# Patient Record
Sex: Male | Born: 1945 | Race: White | Hispanic: No | Marital: Single | Smoking: Never smoker
Health system: Southern US, Community
[De-identification: ages and names within clinical notes are randomized; demographics above are authoritative.]

## PROBLEM LIST (undated history)

## (undated) DIAGNOSIS — R3129 Other microscopic hematuria: Secondary | ICD-10-CM

## (undated) DIAGNOSIS — J386 Stenosis of larynx: Secondary | ICD-10-CM

## (undated) DIAGNOSIS — H905 Unspecified sensorineural hearing loss: Secondary | ICD-10-CM

## (undated) DIAGNOSIS — M3130 Wegener's granulomatosis without renal involvement: Secondary | ICD-10-CM

## (undated) DIAGNOSIS — N059 Unspecified nephritic syndrome with unspecified morphologic changes: Secondary | ICD-10-CM

## (undated) DIAGNOSIS — R42 Dizziness and giddiness: Secondary | ICD-10-CM

## (undated) DIAGNOSIS — D509 Iron deficiency anemia, unspecified: Secondary | ICD-10-CM

## (undated) DIAGNOSIS — M542 Cervicalgia: Secondary | ICD-10-CM

## (undated) DIAGNOSIS — E538 Deficiency of other specified B group vitamins: Secondary | ICD-10-CM

## (undated) DIAGNOSIS — L719 Rosacea, unspecified: Secondary | ICD-10-CM

## (undated) HISTORY — DX: Dizziness and giddiness: R42

## (undated) HISTORY — PX: NOSE SURGERY: SHX723

## (undated) HISTORY — DX: Wegener's granulomatosis without renal involvement: M31.30

## (undated) HISTORY — DX: Stenosis of larynx: J38.6

## (undated) HISTORY — DX: Rosacea, unspecified: L71.9

## (undated) HISTORY — DX: Other microscopic hematuria: R31.29

## (undated) HISTORY — DX: Deficiency of other specified B group vitamins: E53.8

## (undated) HISTORY — DX: Cervicalgia: M54.2

## (undated) HISTORY — PX: OTHER SURGICAL HISTORY: SHX169

## (undated) HISTORY — DX: Unspecified nephritic syndrome with unspecified morphologic changes: N05.9

## (undated) HISTORY — DX: Iron deficiency anemia, unspecified: D50.9

## (undated) HISTORY — DX: Unspecified sensorineural hearing loss: H90.5

## (undated) HISTORY — PX: GASTRIC BYPASS: SHX52

---

## 2002-01-26 ENCOUNTER — Emergency Department (HOSPITAL_COMMUNITY): Admission: EM | Admit: 2002-01-26 | Discharge: 2002-01-26 | Payer: Self-pay | Admitting: *Deleted

## 2004-02-16 ENCOUNTER — Inpatient Hospital Stay (HOSPITAL_COMMUNITY): Admission: EM | Admit: 2004-02-16 | Discharge: 2004-02-20 | Payer: Self-pay | Admitting: Emergency Medicine

## 2004-02-22 ENCOUNTER — Inpatient Hospital Stay (HOSPITAL_COMMUNITY): Admission: EM | Admit: 2004-02-22 | Discharge: 2004-02-26 | Payer: Self-pay | Admitting: Emergency Medicine

## 2006-08-07 ENCOUNTER — Ambulatory Visit: Payer: Self-pay | Admitting: General Practice

## 2006-08-18 ENCOUNTER — Encounter: Payer: Self-pay | Admitting: Specialist

## 2006-09-11 ENCOUNTER — Encounter: Payer: Self-pay | Admitting: Specialist

## 2015-01-12 ENCOUNTER — Ambulatory Visit: Payer: Self-pay | Admitting: Internal Medicine

## 2015-02-02 ENCOUNTER — Inpatient Hospital Stay: Payer: Self-pay | Admitting: Internal Medicine

## 2015-02-03 DIAGNOSIS — I517 Cardiomegaly: Secondary | ICD-10-CM

## 2015-02-05 ENCOUNTER — Other Ambulatory Visit: Payer: Self-pay | Admitting: Physician Assistant

## 2015-02-05 DIAGNOSIS — D649 Anemia, unspecified: Secondary | ICD-10-CM

## 2015-02-05 DIAGNOSIS — R531 Weakness: Secondary | ICD-10-CM

## 2015-02-10 ENCOUNTER — Ambulatory Visit
Admit: 2015-02-10 | Disposition: A | Discharge status: Home / Self Care | Payer: Self-pay | Attending: Internal Medicine | Admitting: Internal Medicine

## 2015-02-11 DIAGNOSIS — R001 Bradycardia, unspecified: Secondary | ICD-10-CM

## 2015-04-06 LAB — SURGICAL PATHOLOGY

## 2015-04-12 NOTE — Consult Note (Signed)
Pt with kidney biopsy today, await findings.  He does have heme pos stool and I plan to do EGD Monday and then colon if needed.  Dr. Servando SnareWohl to cover for me til Monday.  Electronic Signatures: Scot JunElliott, Boniface Goffe T (MD)  (Signed on 26-Feb-16 17:57)  Authored  Last Updated: 26-Feb-16 17:57 by Scot JunElliott, Forrest Demuro T (MD)

## 2015-04-12 NOTE — Consult Note (Signed)
Chief Complaint:  Subjective/Chief Complaint Patient seen briefly for chart review and examination.  Paitent admitted with significant anemia, weight loss, found with heme positive stool.  Denies other GI sx except poor appetite.  EGD done today showing post operative/gastric bypass anatomy, this likely contributory to nutritional deficits including iron def.   VITAL SIGNS/ANCILLARY NOTES: **Vital Signs.:   29-Feb-16 12:55  Vital Signs Type Routine  Temperature Temperature (F) 97.5  Temperature Source oral  Pulse Pulse 53  Respirations Respirations 18  Systolic BP Systolic BP 449  Diastolic BP (mmHg) Diastolic BP (mmHg) 72  Mean BP 86  Pulse Ox % Pulse Ox % 98  Pulse Ox Activity Level  At rest  Oxygen Delivery Room Air/ 21 %   Brief Assessment:  Cardiac Regular   Respiratory clear BS   Gastrointestinal details normal Soft  Nontender  Nondistended  No masses palpable  Bowel sounds normal   Lab Results: Routine BB:  26-Feb-16 11:53   ABO Group + Rh Type A Positive  Antibody Screen NEGATIVE (Result(s) reported on 06 Feb 2015 at 01:34PM.)  Routine Chem:  26-Feb-16 03:58   Glucose, Serum 97  BUN  63  Creatinine (comp)  2.68  Sodium, Serum 141  Potassium, Serum  3.4  Chloride, Serum 102  CO2, Serum 29  Calcium (Total), Serum  7.3  Anion Gap 10  Osmolality (calc) 299  eGFR (African American)  31  eGFR (Non-African American)  25 (eGFR values <38m/min/1.73 m2 may be an indication of chronic kidney disease (CKD). Calculated eGFR, using the MRDR Study equation, is useful in  patients with stable renal function. The eGFR calculation will not be reliable in acutely ill patients when serum creatinine is changing rapidly. It is not useful in patients on dialysis. The eGFR calculation may not be applicable to patients at the low and high extremes of body sizes, pregnant women, and vegetarians.)  Routine Sero:  24-Feb-16 21:06   Occult Blood, Feces POSITIVE (Result(s) reported  on 04 Feb 2015 at 09:25PM.)  Routine Coag:  26-Feb-16 11:53   Prothrombin  16.2 (11.4-15.0 NOTE: New Reference Range  01/09/15)  INR 1.3 (INR reference interval applies to patients on anticoagulant therapy. A single INR therapeutic range for coumarins is not optimal for all indications; however, the suggested range for most indications is 2.0 - 3.0. Exceptions to the INR Reference Range may include: Prosthetic heart valves, acute myocardial infarction, prevention of myocardial infarction, and combinations of aspirin and anticoagulant. The need for a higher or lower target INR must be assessed individually. Reference: The Pharmacology and Management of the Vitamin K  antagonists: the seventh ACCP Conference on Antithrombotic and Thrombolytic Therapy. CQPRFF.6384Sept:126 (3suppl): [id number] A HCT value >55% may artifactually increase the PT.  In one study,  the increase was an average of 25%. Reference:  "Effect on Routine and Special Coagulation Testing Values of Citrate Anticoagulant Adjustment in Patients with High HCT Values." American Journal of Clinical Pathology 2006;126:400-405.)  Routine Hem:  26-Feb-16 03:58   WBC (CBC)  14.8  RBC (CBC)  3.25  Hemoglobin (CBC)  8.1  Hematocrit (CBC)  25.4  Platelet Count (CBC)  626  MCV  78  MCH  24.8  MCHC  31.8  RDW  20.2  Neutrophil % 80.7  Lymphocyte % 6.3  Monocyte % 4.5  Eosinophil % 7.9  Basophil % 0.6  Neutrophil #  12.0  Lymphocyte #  0.9  Monocyte # 0.7  Eosinophil #  1.2  Basophil # 0.1 (  Result(s) reported on 06 Feb 2015 at 04:30AM.)    11:53   WBC (CBC)  14.0  RBC (CBC)  3.32  Hemoglobin (CBC)  8.3  Hematocrit (CBC)  26.2  Platelet Count (CBC)  657  MCV  79  MCH  25.0  MCHC  31.7  RDW  19.5  Neutrophil % 81.0  Lymphocyte % 7.3  Monocyte % 3.8  Eosinophil % 7.6  Basophil % 0.3  Neutrophil #  11.4  Lymphocyte # 1.0  Monocyte # 0.5  Eosinophil #  1.1  Basophil # 0.0 (Result(s) reported on 06 Feb 2015 at  12:29PM.)   Radiology Results: XRay:    22-Feb-16 11:30, Chest PA and Lateral  Chest PA and Lateral   REASON FOR EXAM:    Shortness of breath  COMMENTS:       PROCEDURE: DXR - DXR CHEST PA (OR AP) AND LATERAL  - Feb 02 2015 11:30AM     CLINICAL DATA:  Shortness of breath.    EXAM:  CHEST  2 VIEW    COMPARISON:  None.    FINDINGS:  The heart size and mediastinal contours are within normal limits.  Both lungs are clear. No pneumothorax or pleural effusion is noted.  The visualized skeletal structures are unremarkable.     IMPRESSION:  No acute cardiopulmonary abnormality seen.      Electronically Signed    By: Marijo Conception, M.D.    On: 02/02/2015 12:15         Verified By: Marveen Reeks, M.D.,  [place]:    23-Feb-16 16:59, US Kidney Bilateral  US Kidney Bilateral   REASON FOR EXAM:    ARF  COMMENTS:       PROCEDURE: [place]  - US KIDNEY  - Feb 03 2015  4:59PM     CLINICAL DATA:  69 year old male with acute renal failure.    EXAM:  RENAL/URINARY TRACT ULTRASOUND COMPLETE    COMPARISON:  None.    FINDINGS:  Right Kidney:  Length: 12.6 cm. Renal echogenicity is upper limits of normal. No  mass or hydronephrosis visualized.    Left Kidney:    Length: 13.2 cm. Renal echogenicity is upper limits of normal. No  mass or hydronephrosis visualized.    Bladder:    Decompressed and not well visualized.     IMPRESSION:  Normal sized kidneys with upper limits of normal renal echogenicity  which could be reflection of medical renal disease. No evidence of  hydronephrosis.    Bladder decompressed and not well visualized.      Electronically Signed    By: Margarette Canada M.D.    On: 02/03/2015 17:28         Verified By: Lura Em, M.D.,    26-Feb-16 16:28, US Guide Needle Biopsy Other Than Breast  US Guide Needle Biopsy Other Than Breast   REASON FOR EXAM:    positive ANCA antibody, acute renal failure  COMMENTS:       PROCEDURE: [place]  - US GUIDED BX/ASPIRATION  NOT BR  - Feb 06 2015  4:28PM     CLINICAL DATA:  69 year old with positiveANCA antibody and acute  renal failure. Renal biopsy has been requested.    EXAM:  ULTRASOUND-GUIDED RIGHT RENAL BIOPSY    Physician: Stephan Minister. Anselm Pancoast, MD    FLUOROSCOPY TIME:  None  MEDICATIONS:  2 mg versed, 75 mcg fentanyl. A radiology nurse monitored the  patient for moderate sedation.    ANESTHESIA/SEDATION:  Moderate sedation time:  25 minutes    PROCEDURE:  The procedure was explained to the patient. The risks and benefits  of the procedure were discussed and the patient's questions were  addressed. Specifically, the risk of bleeding was discussed with the  patient. Informed consent was obtained from the patient.    Patient was unable to lay completely prone. Patient was placed in a  modified right lateral decubitus position. The right kidney was felt  to be the most accessible for biopsy. The right lower flank was  prepped with chlorhexidine. A sterile field was created. The skin  and soft tissues were anesthetized with 1% lidocaine. 16 gauge Bard  core needle was directed into the right kidney lower pole cortex  with ultrasound guidance. Core biopsies obtained. A total of 3 core  biopsies were obtained. Bandage placed over the puncture site.    FINDINGS:  Normal appearance of the right kidney without hydronephrosis. Core  samples were obtained from the lower pole cortex. No significant  bleeding following the core biopsies.    COMPLICATIONS:  None   IMPRESSION:  Ultrasound-guided core biopsies of the right kidney lower pole.      Electronically Signed    By: Markus Daft M.D.    On: 02/06/2015 18:00         Verified By: Burman Riis, M.D.,  CT:    25-Feb-16 18:56, CT Abdomen and Pelvis Without Contrast  CT Abdomen and Pelvis Without Contrast   REASON FOR EXAM:    (1) weight loss, ureteral mass at [facility]; (2) weight   loss, previous pelvic mass;  COMMENTS:       PROCEDURE: CT  - CT  ABDOMEN AND PELVIS W0  - Feb 05 2015  6:56PM     CLINICAL DATA:  69 year old with weight loss, weakness, anemia and  acute renal failure. History of gastrectomy.    EXAM:  CT ABDOMEN AND PELVIS WITHOUT CONTRAST    TECHNIQUE:  Multidetector CT imaging of the abdomen and pelvis was performed  following the standard protocol without IV contrast.  COMPARISON:  None.    FINDINGS:  There are subtle parenchymal and centrilobular densities at the lung  bases, particularly on the right side. Question a right middle lobe  nodule measuring 5 mm on the first image. No significant pleural  fluid. No evidence free intraperitoneal air.    The gallbladder has been removed. No gross abnormality to the liver,  spleen or pancreas on this non contrast examination. Surgical  changes in the stomach. Prominent periaortic lymph node in the lower  chest measures 1.3 x 1.1 cm on sequence 2, image 11. Normal  appearance of the adrenal glands. There is no evidence for kidney  stones or hydronephrosis. No gross abnormality to either kidney.  No significant free fluid or lymphadenopathy within the abdomen or  pelvis. The prostate is prominent measuring 5.1 x 6.0 x 4.6 cm. No  gross abnormality to the urinary bladder. Cecum is in the right  upper abdomen and there is a normal appearance of the appendix. Oral  contrast within the small bowel and right colon. No evidence for a  bowel obstruction. There is mild edema and stranding within the  central abdominal mesentery. There is also mild subcutaneous edema.    Joint space narrowing in both hips. Degenerative facet arthropathy  in the lower lumbar spine.     IMPRESSION:  No acute abnormalities within the abdomen or pelvis.    Postsurgical changes compatible  with gastric surgery and  cholecystectomy.    There is mild edema in the central abdominal mesentery. Mesenteric  edema may be related to fluid overload because there is also  subcutaneous  edema.    Patchy parenchymal disease at the lung bases, particularly on the  right side. Cannot exclude a right pulmonary nodule in the right  middle lobe. Findings could be related to an infectious or  inflammatory process. Recommend further evaluation with a dedicated  chest CT.    Prostate enlargement.    Electronically Signed    By: Markus Daft M.D.    On: 02/05/2015 19:11         Verified By: Burman Riis, M.D.,   Assessment/Plan:  Assessment/Plan:  Assessment as noted above.  I have discussed the risks benefits and complications of colonoscopy to include not limited to bleeding infectiion perforation and sedationa nd he wishes to proceed.  Scheduled for tomorrow.   Electronic Signatures: Loistine Simas (MD)  (Signed [phone] 20:23)  Authored: Chief Complaint, VITAL SIGNS/ANCILLARY NOTES, Brief Assessment, Lab Results, Radiology Results, Assessment/Plan   Last Updated: 29-Feb-16 20:23 by Loistine Simas (MD)

## 2015-04-12 NOTE — Consult Note (Signed)
Pt abd non tender, non distended, bowel sound present.  He is to have CT today.  Stool heme pos for occult blood.  Depending on CT scan will possibly schedule EGD for tomorrow.  Other consult notes noted.  Electronic Signatures: Scot JunElliott, Felecia Stanfill T (MD)  (Signed on 25-Feb-16 15:21)  Authored  Last Updated: 25-Feb-16 15:21 by Scot JunElliott, Robina Hamor T (MD)

## 2015-04-12 NOTE — Consult Note (Signed)
Brief Consult Note: Diagnosis: arf, anemia, migratory polyarthralgia, wt loss, hematuria, proteinuria, diarrhea, b12 def, iron def, HX OF URETERAL MASS.   Patient was seen by consultant.   Comments: SEE DICTATED NOTE...1..MAY BE MYELOMA, INTACT OR LIGHT CHAIN TYPE, , WITH ANEMIA, RENAL FAILURE HIGH TP WITH LOW ALBUMIN....NEED TO REVIEW LIGHT CHAINS AND SPEP RESULTS ASAP  2. POSSSIBLE AUTOINMMUNE DISEASE, ANA PENDING, ARTHRALGIAS, RASH, BETTER ON PREDNISONE, WOULD CONSIDER SKIN BX IF ELECTROPHERERESIS NORMAL.  3. B12 DEF NEEDS REPLACEMENT.  4. BORDERLINE LOW IRON WOULD REPLACE PO  5. PATIENT GIV[place] [facility] OF [facility] [facility], POSSIBLE DYE INJURY   6. NOTE PATIENT GIV[facility] H[facility] POS[facility] FOR MASS LESION URETER, [facility].Marland Kitchen.Marland Kitchen.NEED THOSE RECORDS.......F/U ELECTROPHERESIS RESULTS TOMORROW, ANA AND OTHER SEROLOGY RESULTS, ASAP.. IF ELECTROPHERESIS ABNORMAL WILL NEED BM...  Electronic Signatures: Marin RobertsGittin, Robert G (MD)  (Signed 24-Feb-16 16:19)  Authored: Brief Consult Note   Last Updated: 24-Feb-16 16:19 by Marin RobertsGittin, Robert G (MD)

## 2015-04-12 NOTE — Consult Note (Signed)
Chief Complaint:  Subjective/Chief Complaint seeen for anemia, heme positive stool.  On arrival to endoscopy, he related having had  lunch including broth.  denies nausea and abdominalpain.   VITAL SIGNS/ANCILLARY NOTES: **Vital Signs.:   01-Mar-16 13:50  Vital Signs Type Routine  Temperature Temperature (F) 97.6  Temperature Source oral  Pulse Pulse 45  Respirations Respirations 19  Systolic BP Systolic BP 809  Diastolic BP (mmHg) Diastolic BP (mmHg) 70  Mean BP 84  Pulse Ox % Pulse Ox % 98  Pulse Ox Activity Level  At rest  Oxygen Delivery Room Air/ 21 %  *Intake and Output.:   01-Mar-16 00:25  Stool  large loose bowel movement due to golytely    03:36  Stool  large loose brown bowel movement due to golytely    06:11  Stool  large loose stool   Brief Assessment:  Cardiac Irregular  bradycardia   Respiratory clear BS   Gastrointestinal details normal Soft  Nontender  Nondistended  Bowel sounds normal   Lab Results: Cardiology:  01-Mar-16 14:41   Ventricular Rate 43  Atrial Rate 43  P-R Interval 156  QRS Duration 98  QT 510  QTc 430  P Axis 45  R Axis 34  T Axis 46  ECG interpretation Marked sinus bradycardia Abnormal ECG When compared with ECG of 02-Feb-2015 11:13, Premature atrial complexes are no longer Present Vent. rate has decreased BY  45 BPM ----------unconfirmed---------- Confirmed by OVERREAD, NOT ([id number]), editor PEARSON, BARBARA ([id number]) on 02/10/2015 2:47:52 PM  Routine Chem:  01-Mar-16 06:28   Glucose, Serum  127  BUN  55  Creatinine (comp)  2.17  Sodium, Serum 138  Potassium, Serum 3.7  Chloride, Serum 101  CO2, Serum 28  Calcium (Total), Serum  7.6  Anion Gap 9  Osmolality (calc) 292  eGFR (African American)  39  eGFR (Non-African American)  32 (eGFR values <97m/min/1.73 m2 may be an indication of chronic kidney disease (CKD). Calculated eGFR, using the MRDR Study equation, is useful in  patients with stable renal function. The eGFR  calculation will not be reliable in acutely ill patients when serum creatinine is changing rapidly. It is not useful in patients on dialysis. The eGFR calculation may not be applicable to patients at the low and high extremes of body sizes, pregnant women, and vegetarians.)  Routine Hem:  27-Feb-16 04:20   Erythrocyte Sed Rate  107 (Result(s) reported on 07 Feb 2015 at 05:43AM.)  01-Mar-16 06:28   WBC (CBC)  17.4  RBC (CBC)  3.45  Hemoglobin (CBC)  8.4  Hematocrit (CBC)  27.4  Platelet Count (CBC)  624  MCV  79  MCH  24.3  MCHC  30.7  RDW  19.8  Neutrophil % 92.1  Lymphocyte % 5.0  Monocyte % 2.8  Eosinophil % 0.0  Basophil % 0.1  Neutrophil #  16.1  Lymphocyte #  0.9  Monocyte # 0.5  Eosinophil # 0.0  Basophil # 0.0 (Result(s) reported on 10 Feb 2015 at 0[facility])   Radiology Results: Cardiology:    01-Mar-16 14:41, ECG  Ventricular Rate 43  Atrial Rate 43  P-R Interval 156  QRS Duration 98  QT 510  QTc 430  P Axis 45  R Axis 34  T Axis 46  ECG interpretation   Marked sinus bradycardia  Abnormal ECG  When compared with ECG of 02-Feb-2015 11:13,  Premature atrial complexes are no longer Present  Vent. rate has decreased BY  45 BPM  ----------  unconfirmed----------  Confirmed by OVERREAD, NOT ([id number]), editor PEARSON, BARBARA ([id number]) on 02/10/2015 2:47:52 PM  ECG    Assessment/Plan:  Assessment/Plan:  Assessment 1) anemia, heme positive stool.  unable to do colonoscopy today due to patient having lunch.   However also of nte is that the heart rate is changed, being bradycardic.  No abnormal rythm otherwise.  discussed with Dr Tressia Miners.  Will hold proceedure today, will give full liquids today, mag citrate tonight for tomorrow.  NPO after mn.   Plan as above.   Electronic Signatures: Loistine Simas (MD)  (Signed 01-Mar-16 15:26)  Authored: Chief Complaint, VITAL SIGNS/ANCILLARY NOTES, Brief Assessment, Lab Results, Radiology Results, Assessment/Plan   Last  Updated: 01-Mar-16 15:26 by Loistine Simas (MD)

## 2015-04-12 NOTE — Consult Note (Signed)
Pt consult note dictated.  Weight loss, weakness, very elevated sed rate, myalgias, anemia, renal failure.  Wonder about multiple myeloma, await labs  and consider cryoglobulin levels.  Has unusualy petechiae in finger tips and feet. had a colon 3 years ago and was neg.  Will follow with you.  Electronic Signatures: Manya Silvas (MD)  (Signed on 23-Feb-16 18:16)  Authored  Last Updated: 23-Feb-16 18:16 by Manya Silvas (MD)

## 2015-04-12 NOTE — Consult Note (Signed)
PATIENT NAME:  Dakota Whitaker, Dakota Whitaker MR#:  [id number] DATE OF BIRTH:  [birth year]  DATE OF CONSULTATION:  02/03/2015  CONSULTING PHYSICIAN:  Manya Silvas, MD  HISTORY OF PRESENT ILLNESS: The patient is a 69 year old white male who has had weakness, muscle aching, multiple soft watery stools, weight loss of 10 pounds or more, shortness of breath with exertion. He presented to the ER and was found to have a hemoglobin of 6.5 and was admitted, transfused. A sedimentation rate was over 100. I was asked to see him in consultation.   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 At that time they did a cholecystectomy. He also had a hernia repair. Colonoscopy was done 3 years ago at the [place], was negative.   ALLERGIES: No known drug allergies.  REVIEW OF SYSTEMS: He denies any significant pain. No fever. No headaches. Hearing has  fluctuated somewhat. No nosebleeds. He does not have any upper teeth. No thyroid trouble. No chest pains. He did have some irregular heartbeats when he almost fainted at church. Denies any abdominal pain. No dysphagia. He does have myalgias of the arms and legs that comes and goes. He has a petechial rash over his fingers and ankles. He has been unable to work for the last 4-6 months because of profound weakness.  PHYSICAL EXAMINATION: GENERAL: A pleasant white male in no acute distress.  HEAD: Atraumatic. Sclerae anicteric. Conjunctivae negative. Tongue is negative.  CHEST: Clear.  HEART: Shows no murmurs or gallops I could hear.  ABDOMEN: Shows no hepatosplenomegaly. No masses. No bruits. No significant tenderness.  RECTAL: Exam not done at this time. EXTREMITIES:  Show petechial rash on the tips of his fingers and scattered on his feet. VITAL SIGNS:  Temperature 97.4, pulse 72, respirations 18, blood pressure 95/58, O2 saturation 97% on room air.   LABORATORY DATA:  Glucose 95, BUN 83, creatinine 3.98, sodium 137, potassium 3.9, chloride 99, CO2 of 27, phosphorus 4.2, LDH 128, ferritin 377, uric acid 9.7, folic acid 6.6, total protein 7.2, albumin 1.9, total bilirubin 0.2, direct bilirubin less than 0.1, alkaline phosphatase 94, SGOT 38, SGPT 23, CPK total is 19. Negative urine drug screen. White count 14.7, sedimentation rate 126, hemoglobin 7.3, hematocrit 21.4, platelet count originally 864,000, now 684,000. Reticulocyte count 2.08, absolute reticulocyte count 0.0692. A+ blood with negative antibody screen. HIV is negative. Urinalysis shows 3+ blood, 56 red cells, 12 white cells.   IMAGING:  Ultrasound of the abdomen, kidneys bilaterally, shows normal-sized kidneys with upper limits of normal renal echogenicity. No evidence of hydronephrosis.   ASSESSMENT: He has very elevated sedimentation rate. petechial rash, weight loss, anemia, likely are all connected together and suggest possible collagen vascular disease, multiple myeloma would be a possibility. It is also possible that petechiae could be embolic phenomenon from endocarditis. At this time it is not likely to be of a gastrointestinal origin, although his anemia being hypochromic microcytic could possibly be of a gastrointestinal  origin.   PLAN: To follow with you and if workup is negative, consider upper endoscopy and colonoscopy.    ____________________________ Manya Silvas, MD rte:LT D: 02/03/2015 18:19:53 ET T: 02/03/2015 19:11:25 ET JOB#: [id number]  cc: Manya Silvas, MD, <Dictator> Manya Silvas MD ELECTRONICALLY SIGNED 02/14/2015 11:02

## 2015-04-12 NOTE — Consult Note (Signed)
Pt had large internal hemorrhoids but prep prevented seeing the last small portion of the colon.  No further plans to repeat since he has the autoimmune kidney disease and sinus bradycardia and had a previous normal colon 3 years ago.    Electronic Signatures: Scot JunElliott, Tony Friscia T (MD)  (Signed on 02-Mar-16 14:13)  Authored  Last Updated: 02-Mar-16 14:13 by Scot JunElliott, Kianni Lheureux T (MD)

## 2015-04-12 NOTE — H&P (Signed)
PATIENT NAME:  Dakota Whitaker, Dakota Whitaker MR#:  [id number] DATE OF BIRTH:  Apr 24, 1946  DATE OF ADMISSION:  02/02/2015   PRIMARY CARE PHYSICIAN:  Air traffic controller.   REFERRING EMERGENCY ROOM PHYSICIAN:  Larae Grooms, MD   PRESENTING COMPLAINT: Weakness, and presyncope yesterday,   HISTORY OF PRESENT ILLNESS: This very pleasant 69 year old man with history of gastric bypass in the remote past, gastrointestinal bleed due to NSAIDS about 15 years ago presents with 4 months of weakness. He reports that he was diagnosed with and treated for pneumonia before Thanksgiving.  His respiartory symtpoms improved. However, since that time, he has continued to be very weak.  He had thought this was due to just recovering from pneumonia. He reports that his muscles hurt all the time. He is so tired he can barely get up and walk around. Yesterday, in church he had a near syncopal event and had to be held up by other people. He reports no recent nausea, vomiting, and diarrhea. No recent blood in his stool or dark tarry stools.  No abdominal pain. He reports that he has multiple soft watery stools but he has also not been eating very much.  He reports weight loss over the past few months of about 10 pounds.  He does have a new skin rash over his hands and feet which is not painful. No tender swollen joints. No chest pain.  He does have shortness of breath with exertion. No cough, no sputum production. No sweats or chills. On presentation to this Emergency Room his hemoglobin is found to be 6.5. He has no known history of anemia. He is being admitted for further evaluation. He was initially to be transferred to the [facility] but they have no beds, so he is being admitted to [facility] until he can be transferred to the [place].     PAST MEDICAL HISTORY:  1.  History of gastrointestinal bleed about 15 years ago due to nonsteroidal anti-inflammatory use.  2.  History of gastric bypass surgery about 20 years ago.  3.   Cholecystectomy.  4.  Hernia repair.   ALLERGIES: No known allergies.   HOME MEDICATIONS:  Takes Tylenol 500 mg 2 tablets every 6 hours as needed for pain.   SOCIAL HISTORY: He lives alone. He reports he does not smoke cigarettes, drink alcohol, or use any illicit substances.  He reports that he has not been able to work for the past 4 to 6 months due to profound weakness.   FAMILY MEDICAL HISTORY: Negative for coronary artery disease, stroke or diabetes. His mother had breast cancer.   REVIEW OF SYSTEMS:  CONSTITUTIONAL: Positive for fatigue and weakness. No pain. Positive for weight loss.  HEENT: No pain in eyes or ears. No change in vision or hearing. No sinus congestion, sore throat or difficulty swallowing.  RESPIRATORY: Positive for shortness of breath with exertion. No cough, wheezing, hemoptysis, no history of chronic obstructive pulmonary disease.  CARDIOVASCULAR: No chest pain, orthopnea, or edema. No palpitations. He did have a presyncopal event yesterday.  GASTROINTESTINAL: No nausea, vomiting, diarrhea, or abdominal pain. No human hematemesis, melena, or hematochezia. He has had decreased appetite.  GENITOURINARY: No dysuria or frequency.  ENDOCRINE: No polyuria, polydipsia, or hot or cold intolerance.  HEMATOLOGIC: No easy bruising or bleeding.  SKIN: He does have a new rash over the hands and feet. No other new rashes. No wounds.  MUSCULOSKELETAL: He has a moving pain described as a myalgia, pain in the muscles with  movement of the muscles. No tender or swollen joints. No history of gout.  NEUROLOGIC: No focal numbness or weakness. No dementia, CVA, or seizure. No memory loss, has had presyncopal episode yesterday.  PSYCHIATRY:   No bipolar disorder or schizophrenia.   PHYSICAL EXAMINATION:  VITAL SIGNS: Temperature 97.8, pulse 70, respirations 18, blood pressure 120/69 and oxygenation 100% on room air.  GENERAL: No acute distress very pale and weak appearing.  HEENT:  Pupils equal, round, and reactive to light. Conjunctivae are pale. Extraocular motion is intact. Oral mucous membranes are dry.  Posterior oropharynx is clear. No exudate, edema, or erythema. He has poor dentition with gingivitis and several infected-looking teeth. No cervical lymphadenopathy.  Trachea midline. Thyroid nontender.  RESPIRATORY: Lungs clear to auscultation bilaterally with good air movement.  CARDIOVASCULAR: Regular rate and rhythm. No murmurs, rubs, or gallops.  ABDOMEN: Soft, nontender, nondistended. Bowel sounds normal. No guarding, no rebound, no hepatosplenomegaly. No mass.  SKIN: He has a petechial rash over the ankles and feet. He has splinter hemorrhages under the fingernails on the left hand. He has a blood blister on the second digit on the left hand.  He also has bruising or blue-appearance of the fingertip.  No other rashes noted.  MUSCULOSKELETAL: No tender swollen joints. There is no tenderness to the muscles on palpation, but he reports with any movement, he has a lot of pain in the muscles.  Range of motion is normal. Strength 5/5 throughout on the above testing, but this painful.  NEUROLOGIC: Cranial nerves II through XII grossly intact. Strength and sensation are intact bilaterally, nonfocal.  PSYCHIATRIC: The patient is alert and oriented. He is calm, showing no signs of depression or anxiety.   LABORATORY DATA: Sodium 132, potassium 4.2, chloride 95, bicarbonate 27, BUN 84, creatinine 4.3 glucose 108, calcium 7.7. Troponin less than 0.02. White blood cells 16.3, hemoglobin 6.5, platelets 863,000, MCV is 75.   IMAGING: Chest x-ray shows no acute cardiopulmonary abnormalities.   ASSESSMENT AND PLAN:  1.  Profound anemia: We will check iron, TIBC, ferritin, B12 and TSH. We will guaiac stools. We will check a hepatic function panel.  I will consult gastroenterology, as this seems to be consistent with iron deficiency anemia with a low MCV, history of gastrointestinal  bleeding in the past. He denies any recent nonsteroidal anti-inflammatory use. We will start b.i.d. PPI, as he does not seem to be bleeding currently, we will hold off on a Protonix drip.  Recieving 2 U prbc's in the ED. 2.  Leukocytosis: We will check a urinalysis.  Chest x-ray is negative. We will check blood cultures.  He denies any recent fevers or chills. Petechial rash with splinter hemorages under the fingernails and blood blister is worrysome for endocarditis. Poor dentition. 3.  Profound weakness with presyncope: We will check a 2-D echocardiogram and consult physical therapy. 4.  Rash, petechial rash, over the hands and feet with blood blistering and blue fingertips very concerning for vasculitis versus endocarditis.  Checking a 2-D echocardiogram also will check an ESR, blood cultures. 5.  Renal failure: We will check a urinalysis and FENA. We will administer fluids if renal function does not improve significantly over the next 24 hours, would recommend nephrology consultation.  6.  Prophylaxis. No pharmacological deep venous thrombosis prophylaxis in the setting of profound anemia thought to be due to bleeding. He will be on b.i.d. PPI for gastrointestinal prophylaxis.   TIME SPENT ON ADMISSION: 45 minutes.  ____________________________ Earleen Newport. Volanda Napoleon, MD cpw:DT D: 02/02/2015 19:07:45 ET T: 02/02/2015 19:36:53 ET JOB#: [id number]  cc: Earleen Newport. Volanda Napoleon, MD, <Dictator> Aldean Jewett MD ELECTRONICALLY SIGNED 02/03/2015 8:30

## 2015-04-12 NOTE — Consult Note (Signed)
Severe elevation of free Kappa and lambda chains, not sure of significance, leave to hematology for that answer.  Electronic Signatures: Scot JunElliott, Robert T (MD)  (Signed on 25-Feb-16 18:25)  Authored  Last Updated: 25-Feb-16 18:25 by Scot JunElliott, Robert T (MD)

## 2015-04-12 NOTE — Consult Note (Signed)
PATIENT NAME:  Dakota Whitaker, Dakota Whitaker MR#:  [id number] DATE OF BIRTH:  [birth year]  DATE OF CONSULTATION:  02/04/2015  REFERRING PHYSICIAN:   CONSULTING PHYSICIAN:  Simonne Come. Banjamin Stovall, MD  This patient was admitted on February 22 with weakness and presyncope. Hematology was consulted on February 24 for persistent anemia with renal failure and low-level positive urine electrophoresis result. The patient was seen and evaluated by me, discussed with medicine and a written note placed in the chart on February 24. This full narrative follows at a slightly later date, reflects the evaluation of that day.   The patient is a 69 year old man, and he was admitted with weakness as described. He had a near-syncopal episode in church, was brought to the hospital. Pertinent history includes the fact that he had pneumonia treated before Thanksgiving. He has had some weakness since that time. He has had some wax and wane at times, more severe skeletal pain and aches and pains in the lower extremities. He developed a rash in his hands, also reportedly feet, which appears to be gone by this time, but rash of the hands without swelling. He did not report any dark stools or blood in the stools. On presentation to the Emergency Room, his creatinine was elevated, his hemoglobin was 6.5, and he was admitted. He is a patient who takes primary care at the [place] in [place], but he could not be transferred there. At the time that I saw him, he had had 2-unit transfusion and his hemoglobin was still 8.3. He had had slight leukocytosis in addition to his weakness and anemia, a petechial rash on the hands, blood blistering in the hands and fingers, acute renal failure though baseline creatinine was unknown.   PAST MEDICAL HISTORY: Included gastric bypass surgery 20 years ago. B12 deficiency in the past, for which he had taken injections at one point and oral B12 at another time in the past but not for years. Reported GI bleed 15 years ago, apparently  due to the use of nonsteroidal medications. Cholecystectomy and hernia done.   ALLERGIES: No known allergies.   FAMILY HISTORY: His mother had a breast cancer; age was not reported.   SOCIAL HISTORY: Not a smoker. No alcohol.   ALLERGIES: No known allergies.  REVIEW OF SYSTEMS: CONSTITUTIONAL: On my system review, he had some general weakness but stronger than he was on admission when he was more anemic and had since received red blood cell transfusions. He apparently has had about a 60-pound weight loss in 4 to 6 months. RESPIRATORY: No current shortness of breath. He was short of breath on exertion. No cough, wheezing, or hemoptysis. CARDIAC: No chest pain, no retrosternal chest pain or palpitations. GASTROINTESTINAL: No abdominal pain, nausea, vomiting, diarrhea, though admits to a decreased appetite. GENITOURINARY: No dysuria. No polyuria, polydipsia. HEMATOLOGIC: No other prior history of easy bleeding or bruising. INTEGUMENTARY: The skin rash on the hands has been described. MUSCULOSKELETAL: Skeletal pain more myalgia than arthralgia; would indicate that he also had pain in his knee, but the thighs and calves waxing and waning. Notably, he reported that he was in significant distress when he was admitted. He was subsequently started on 20 mg p.o. prednisone and no symptoms; muscle weakness and muscle stiffness had completely resolved.  NEUROLOGIC: No focal weakness. No seizure, stroke. No memory loss. PSYCHIATRIC: There is no history of psychiatric disorder.   PHYSICAL EXAMINATION:  VITAL SIGNS: He was initially, when I saw him, afebrile.  GENERAL: He was alert and  cooperative. There was slight pallor.  HEAD AND NECK: Sclerae with no jaundice. There was no thrush. I palpated no lymph nodes in the neck, supraclavicular, submandibular, or axilla. He had poor dentition as noted, also, by admitting physician. HEART: Regular.  LUNGS: Clear. No wheezing, rales, or rhonchi.  ABDOMEN:  Nontender. No palpable mass or organomegaly. EXTREMITIES. No edema or swollen, hot or tender joints, and he had no tenderness in the muscles. No pain on moving when I saw him.  SKIN: He had splinter hemorrhages of the fingernails in the left hand, blood blisters on the digits of the left hand. He had some petechial rash scattered a few areas over the right dorsum of the hand and fingers.  NEUROLOGIC: No gross focal weakness. Alert and cooperative. Cranial nerves were intact.   LABORATORY DATA: On admission, his creatinine was 4.3. Troponin was low. White count was 16.3, hemoglobin was 6.5, the platelets were 863,000. The MCV was 74. He additionally has, from February 22, admission chest x-ray was clear. There was no differential done initially. February 22 ferritin was 292, serum iron was 33, and iron saturation was 18%. A B12 level was low at 188. His TSH was normal at 1.04. He had urine that later revealed 10,000 Enterococcus faecalis. On February 23, white count was still 14.7, neutrophils were 11.8, and the lymphocytes were 1.3. Sedimentation rate was 126. An echo Doppler was performed, and this was done to evaluate the heart valves for possible endocarditis. A number of the valves were reportedly not well visualized. There was some left ventricular diastolic filling defect. Multiple serologies including ANA were performed. They were pending at the time of initial evaluation. Later, ANA came back negative and ANCA panel came back with c-ANCA high at 1:320 titer and the PR3 antibodies were also high at 10.3 units per milliliter. Additional results: Anti-DNA ACE antibodies and glomerular basement membrane antibodies and compliment studies were pending. Kappa and lambda light chains were pending at the time of evaluation. Later, they showed some elevated kappas 378, elevated lambdas 162, with a ratio of 8.34, so certainly elevated out of normal, but the ratio close to normal was most suggestive of secondary to  renal disease and not primary for myeloma. Hepatitis C virus antibody was negative. Protein electrophoresis serum was negative with no M spike. HIV antibodies were nonreactive and an RPR test was nonreactive. Uric acid was high at 9.7. The reticulocytes were 2.08, the folate was 6.6, a direct Coombs was negative. The LDH was 128. The haptoglobin was high at 444. There was no evidence of hemolysis, either immune or nonimmune. The protein electrophoresis run in the urine was positive for very low level. Protein was 62.8 mg/dL total, 10% of that was an M spike. From February 24, the white count was still 12.7 and neutrophils were still elevated at 11.2. An occult blood test was positive 1 result on February 24.   IMPRESSION AND PLAN: Also as listed and summarized in the brief consult note of February 24, there are a number of issues actively need as follows:  1.  Myeloma was initially considered but results later speak against multiple myeloma. Very low ratio, although abnormal with the serum free light chains, so that light chains and a 24-hour urine for light chain burden and serial tests need to be followed, but a bone marrow was not initially recommended. It was felt that this was not myeloma.  2.  Most likely autoimmune disease, considering the myalgias and arthralgias, the  lack of other diagnosis, the response of joint and muscle pain to prednisone, the petechial-appearing rash in the absence of signs or symptoms of endocarditis. Infectious diseases and cardiology were asked to look at and consider if there is any evidence of endocarditis. I had recommended that if there is no diagnosis, that skin biopsy be considered.  3. The patient has B12 deficiency which needs replacement. This looks like it is 69 years old secondary to bypass surgery.  4.  Borderline low iron, probably also due to gastric bypass, but he had a positive stool, abdominal pain with nausea at least, weight loss, so that he was followed by  gastroenterology with plans to do luminal studies later, which is appropriate. 5.  The patient gave a history, not on his first interview but a subsequent interview, that he had had evaluation at the [facility]. He believes that CAT scan showed some kind of mass or abnormality or density in his prostate or his pelvis or his ureter and he was to go back for followup, which he was unsure if this was a biopsy or a cystoscopic procedure, but he was diagnosed with some abnormality so that later discussed with medicine. The next step was at least initially noncontrast CT scan of the abdomen and pelvis and, of course, to review those [employer].   In summary, as above, pursuing primary cause of renal disease and looked like renal biopsy was being considered. I thought multiple myeloma was ruled out at this time, but will need serial light chains and 24-hour urine and a serial CBC. The patient has leukocytosis which looks reactive. Would continue to follow. Currently no evidence of suspicion of CML. The patient had low iron; that is going to be pursued by GI with luminal studies, but it likely relates to gastric bypass surgery. He has B12 deficiency, it looks like it relates to gastric bypass surgery. He has weight loss, which certainly could be an occult malignancy. He is going to have CT studies. He has already had some studies done at the [place]. He reports a poor appetite and weight loss could be due to whatever is the cause of the primary renal disease. Otherwise, continue to pursue occult and possibly a GI malignancy.  He gave a family history that his mother had breast cancer; just pursue and confirm that. Check the ages of that. Rule out other family members with cancer. The patient was to be followed by hematology during this hospitalization.   ____________________________ Simonne Come Inez Pilgrim, MD rgg:ST D: 02/07/2015 21:18:00 ET T: 02/07/2015 22:23:00  ET JOB#: [id number]  cc: Simonne Come. Inez Pilgrim, MD, <Dictator> Dallas Schimke MD ELECTRONICALLY SIGNED 02/17/2015 21:51

## 2015-04-12 NOTE — Consult Note (Signed)
Pt with normal post gastric bypass surgery.  Will do colonoscopy Wednesday or tomorrow and if neg will get capsule endo.  May have clear liquids.  Dr. Marva PandaSkulskie to do colon tomorrow.  Electronic Signatures: Scot JunElliott, Robert T (MD)  (Signed on 29-Feb-16 10:28)  Authored  Last Updated: 29-Feb-16 10:28 by Scot JunElliott, Robert T (MD)

## 2015-04-12 NOTE — Discharge Summary (Signed)
PATIENT NAME:  Dakota Whitaker, Dakota Whitaker MR#:  [id number] DATE OF BIRTH:  Aug 08, 1946  DATE OF ADMISSION:  02/02/2015 DATE OF DISCHARGE:    ADMITTING PHYSICIAN:  Myrtis Ser, MD.    DISCHARGING PHYSICIAN:  Gladstone Lighter, MD.    PRIMARY CARE PHYSICIAN: At the [facility]:  1.  Nephrology consultation by Dr. Anthonette Legato.  2.  GI consultation by Dr. Vira Agar.  3.  Hematology consultation by Dr. Inez Pilgrim.  4.  Cardiology consultation by Dr. Rockey Situ.   DISCHARGE DIAGNOSES:  1.  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  2.  Acute on chronic anemia requiring 3 units packed red blood cell transfusion, hemoglobin is stable around 7.4.  3.  Vitamin D deficiency.  4.  Vitamin B12 deficiency.   DISCHARGE MEDICATIONS:   1. Tylenol 500 mg 1-2 tablets q. 6 hours p.r.n. for pain.  2. Prednisone 70 mg p.o. daily.  3. Epogen 20,000 units subcutaneous once a week.  4. Senokot 1 tablet p.o. b.i.d. p.r.n. for constipation.  5. Protonix 40 mg p.o. b.i.d.  6. Vitamin D3, 5000 international units once a day.  7. Vitamin D2, 50,000 international units capsule once a week for 12 weeks.  8. Cyclophosphamide 125 mg p.o. daily.  9. Vitamin B12, 1000 mcg/mL injectable solution once a week.   DISCHARGE DIET: Regular diet.   DISCHARGE ACTIVITY: As tolerated.    FOLLOWUP INSTRUCTIONS:  1. Follow up with Dr. Anthonette Legato in 1 week.  2. GI followup in 2-4 weeks.  3. PCP followup in 2 weeks.  4. Physical therapy   LABORATORY AND IMAGING STUDIES PRIOR TO DISCHARGE: Sodium 142, potassium 3.4, chloride 104, bicarbonate 30, BUN 44, creatinine 1.61, glucose 108, and calcium of 7.3. Hemoglobin is 7.3, hematocrit 27, platelet count 624,000, WBC 17.4 while on steroids. Protein immunoelectrophoresis showed elevated protein, but no M spike observed. Free kappa and lambda chains were elevated nonspecific.  Renal biopsy was done on the right  kidney on 02/06/2015. ESR was elevated to greater than 120 on admission. Urine immunoelectrophoresis again did not show any M spike. CT of the abdomen and pelvis without contrast showing no acute abnormalities, mild edema in central abdominal mesentery, patchy parenchymal disease at lung bases on the right side, cannot rule out pulmonary nodule, could be infectious or inflammatory process. Echo Doppler showing ejection fraction is 55%-60%, mild LVH noted.  LDH, Coombs test, haptoglobin all are normal limits. Reticulocyte count, absolute reticulocyte count were within normal limits as well. Vitamin B12 is low at 162 pg/dl  Vitamin D level is low at 4.5 NG/mL.  HIV test is negative. Protein electrophoresis did not show any M spike as well. Urinalysis had 3 + blood and 1 + bacteria, few WBCs, trace RBCs seen. Urine cultures growing only 10,000 colonies of enterococcus on admission. The patient did receive 3 units of packed RBC transfusion during this hospitalization. ANA panel is negative.  ANCA antibodies, C-ANCA and PR3 antibodies were elevated.    BRIEF HOSPITAL COURSE: [name] is a 69 year old Caucasian male with no significant past medical history, had partial gastrectomy done in the past, presents to the hospital secondary to weakness and presyncope, noted to have significant anemia and also acute renal failure.   1.  Acute on chronic anemia, known to be anemic as an outpatient, history of GI bleed in the past secondary to nonsteroidal anti-inflammatory drugs, who has not been taking any nonsteroidal anti-inflammatory drugs.  Recent  colonoscopy about 3 years ago, it has been negative. The patient has received total 3 units of packed red blood cell transfusion during this admission. He was seen by GI and had an upper GI endoscopy done which did not show any acute gastritis or gastric ulcers. He also had a colonoscopy done, however it was a poor study because the preparation was not complete, however the  colonic mucosa that was observed did not show any acute abnormalities. His hemoglobin had remained stable at 7.5, him not being symptomatic.  He will be started on Epo as an outpatient every week and could benefit from having capsule endoscopy study done as an outpatient. No active bleeding noted. His vitals are otherwise stable. 2.  Acute renal failure and in acute renal failure still was making urine.  All the serological tests came back negative except his C-ANCA and PR3 antibodies were positive and also his ESR was extremely elevated. The patient had a renal biopsy performed and so far immunofluorescence test shows positive for fibrin and positive immune glomerulonephritis. 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 He has received 3 days of high-dose steroids 500 mg Solu-Medrol for 3 days while the biopsy results are pending. Once the preliminary results were available he was started on cyclophosphamide daily and also started on p.o. prednisone from 02/10/2015. The patient will follow with Dr. Holley Raring as an outpatient.  3.  Bradycardia. The patient went into sinus bradycardia, no heart blocks, asymptomatic, more prominent during sleeping, heart rate dipped as low as in the low 40s. Seen by cardiology, since he has been asymptomatic they did not recommend anything. No beta blockers or calcium channel blockers. Outpatient followup with Dr. Fletcher Anon recommended.   4.  Vitamin D deficiency and vitamin B12 deficiency, being replaced appropriately.  5.  His code status has been otherwise uneventful in the hospital: Physical therapy recommended rehabilitation.   DISCHARGE CONDITION: Stable.   DISCHARGE DISPOSITION: Short-term rehabilitation to [facility].   TIME SPENT ON DISCHARGE: 45 minutes.    ____________________________ Gladstone Lighter, MD rk:bu D: 02/12/2015 13:50:00 ET T: 02/12/2015 14:09:52 ET JOB#: [id number]  cc: Tama High, MD Manya Silvas, MD Minna Merritts,  MD Gladstone Lighter, MD, <Dictator>   Gladstone Lighter MD ELECTRONICALLY SIGNED 02/26/2015 18:06

## 2015-04-12 NOTE — Consult Note (Signed)
General Aspect Primary Cardiologist: New to [facility] _____________  69 year old male with history of gastric bypass, gastrointestinal bleed due to NSAIDS about 15 years ago, cholecystectomy, and hernia repair who presented to [facility] on 2/22 with a 4 month history of weakness.  _____________  PMH: 1. Gastric bypass 2. gastrointestinal bleed due to NSAIDS about 15 years ago 3. cholecystectomy 4. Hernia repair _____________   Present Illness 69 year old male with the above problem list who presented to [facility] with the above CC. He denies any previously known cardiac history and has never seen a cardiologist before. He reports that he was diagnosed with and treated for bronchitis/pneumonia just before Thanksgiving.  His respiartory symtpoms improved. However, since that time he has continued to be very weak.  He thought this was due to recovering from pneumonia. He reports that his muscles hurt all the time, especially the left arm. He is so tired he can barely get up and walk around. While in church on 2/21, he had a near syncopal event and had to be held up by other people. He remained in church for the entire service, just laying on the pew. Friends placed damp rags on his forehead. He reports no recent nausea, vomiting, and diarrhea. No recent blood in his stool or dark tarry stools.  No abdominal pain. He reports that he has multiple soft watery stools but he has also not been eating or drinking very much. He takes frequent sips of water throughout the day.  He reports weight loss over the past few months of approximately 60 pounds.  He does have a new skin rash over his left hand which is not painful. No tender swollen joints. He also notes an ulceration on the second digit of the left hand. No chest pain.  He does have shortness of breath with exertion. No cough, no sputum production. No sweats or chills.   Upon arrival to the ED his hemoglobin was found to be 6.5. He has no known history of anemia.  Troponin <0.02. SCr 4.31 (ARF vs CKD). CXR negative. He has subsequently been transfused 3 units with improvement of hgb to 8.4 today. Sed 126. He has been seen by GI with known history of colonoscopy 3 years prior which was negative. Positive of occult blood. Negative HIV. Pending electrophoresis, anti-DNA, and ANCA studies. WBC count initially trending down while not on ABX from 16.3-->12.7 yesterday, however he did have a bump to 14.7 today. He has remained afebrile throughout. Urine protein positive. He was found to have splinter hemorrhages and unusual petechiae in finger tips and feet. He underwent TTE that showed normal EF, diastolic dysfunction, challenging images, unable to comment on valves. He has been seen by Hem/Onc.   Physical Exam:  GEN no acute distress   HEENT hearing intact to voice   NECK supple   RESP normal resp effort  clear BS   CARD Regular rate and rhythm  Normal, S1, S2  No murmur   ABD denies tenderness  soft   EXTR negative edema, splinter hemorrhages along left hand, and unusal petechiae   SKIN normal to palpation   NEURO cranial nerves intact   PSYCH alert, A+O to time, place, person, good insight   Review of Systems:  General: Fatigue  Weakness  weight loss of 60 pounds over 4 months   Skin: Rashes  Hair and nail changes   ENT: Sore Throat  Dysphagia   Eyes: No Complaints   Neck: No Complaints  Respiratory: No Complaints   Cardiovascular: No Complaints   Gastrointestinal: No Complaints   Genitourinary: No Complaints   Vascular: No Complaints   Musculoskeletal: No Complaints   Neurologic: No Complaints   Hematologic: No Complaints   Endocrine: No Complaints   Psychiatric: No Complaints   Review of Systems: All other systems were reviewed and found to be negative   Medications/Allergies Reviewed Medications/Allergies reviewed   Family & Social History:  Family and Social History:  Family History Hypertension  Cancer    Social History negative tobacco, negative ETOH, negative Illicit drugs   Place of Living Home  lives alone     gastis bypass:    Cholecystectomy:   Home Medications: Medication Instructions Status  Tylenol 500 mg oral tablet 2 tab(s) orally every 6 hours, As Needed - for Pain Active   Lab Results:  Routine Micro:  22-Feb-16 19:25   Micro Text Report URINE CULTURE   COMMENT                   HOLDING FOR POSSIBLE PATHOGEN   ANTIBIOTIC                       Culture Comment HOLDING FOR POSSIBLE PATHOGEN  Result(s) reported on 04 Feb 2015 at 11:55AM.  23-Feb-16 14:37   Micro Text Report HIV 1/2 AG AB COMBO   HIV 1/2 ANTIBODIES        NON-REACTIVE ANTIBODY   HIV-1 p24 ANTIGEN         NON-REACTIVE ANTIGEN   INTERPRETATION            NONREACTIVE.  A NONREACTIVE test result means that HIV-1 or HIV-2 antibodies and HIV-1 p24 antigen were not detected in the specimen.   ANTIBIOTIC                       24-Feb-16 16:12   Micro Text Report BLOOD CULTURE   COMMENT                   NO GROWTH IN 8-12 HOURS   ANTIBIOTIC                       Culture Comment NO GROWTH IN 8-12 HOURS  Result(s) reported on 05 Feb 2015 at 12:00AM.    17:18   Micro Text Report BLOOD CULTURE   COMMENT                   NO GROWTH IN 8-12 HOURS   ANTIBIOTIC                       Culture Comment NO GROWTH IN 8-12 HOURS  Result(s) reported on 05 Feb 2015 at 01:00AM.  Routine Chem:  22-Feb-16 11:59   BUN  84  Creatinine (comp)  4.31  Potassium, Serum 4.2  23-Feb-16 05:22   BUN  83  Creatinine (comp)  3.98  Potassium, Serum 3.9  24-Feb-16 05:15   BUN  84  Creatinine (comp)  3.66  Potassium, Serum 3.6  25-Feb-16 06:43   Glucose, Serum 92  BUN  75  Creatinine (comp)  2.97  Sodium, Serum 137  Potassium, Serum  3.2  Chloride, Serum 100  CO2, Serum 29  Calcium (Total), Serum  7.8  Anion Gap 8  Osmolality (calc) 296  eGFR (African American)  27  eGFR (Non-African American)  22 (eGFR values  <39m/min/1.73 m2  may be an indication of chronic kidney disease (CKD). Calculated eGFR, using the MRDR Study equation, is useful in  patients with stable renal function. The eGFR calculation will not be reliable in acutely ill patients when serum creatinine is changing rapidly. It is not useful in patients on dialysis. The eGFR calculation may not be applicable to patients at the low and high extremes of body sizes, pregnant women, and vegetarians.)  Routine Sero:  23-Feb-16 14:37   - HIV 1/2 Antibodies NON-REACTIVE ANTIBODY  - HIV-1 p24 Antigen NON-REACTIVE ANTIGEN  - Interpretation NONREACTIVE.  A NONREACTIVE test result means that HIV-1 or HIV-2 antibodies and HIV-1 p24 antigen were not detected in the specimen.  Result(s) reported on 03 Feb 2015 at 04:01PM.  24-Feb-16 21:06   Occult Blood, Feces POSITIVE (Result(s) reported on 04 Feb 2015 at 09:25PM.)  Routine Hem:  22-Feb-16 11:59   WBC (CBC)  16.3  RBC (CBC)  2.85  Hemoglobin (CBC)  6.5  Hematocrit (CBC)  21.2  Platelet Count (CBC)  863 (Result(s) reported on 02 Feb 2015 at 12:14PM.)  23-Feb-16 05:22   WBC (CBC)  14.7  RBC (CBC)  2.77  Hemoglobin (CBC)  7.3  Hematocrit (CBC)  21.4  Platelet Count (CBC)  684  Erythrocyte Sed Rate  126 (Result(s) reported on 03 Feb 2015 at 06:30AM.)  24-Feb-16 05:15   WBC (CBC)  12.7  RBC (CBC)  3.10  Hemoglobin (CBC)  7.7  Hematocrit (CBC)  24.0  Platelet Count (CBC)  637  25-Feb-16 06:43   WBC (CBC)  14.7  RBC (CBC)  3.36  Hemoglobin (CBC)  8.4  Hematocrit (CBC)  26.2  Platelet Count (CBC)  715  MCV  78  MCH  24.9  MCHC  31.9  RDW  19.8  Neutrophil % 80.0  Lymphocyte % 6.9  Monocyte % 4.9  Eosinophil % 7.7  Basophil % 0.5  Neutrophil #  11.7  Lymphocyte # 1.0  Monocyte # 0.7  Eosinophil #  1.1  Basophil # 0.1 (Result(s) reported on 05 Feb 2015 at 07:56AM.)   EKG:  EKG Interp. by me   Interpretation NSR, 88 bpm, PACs, no significant st/t changes   Radiology  Results: XRay:    22-Feb-16 11:30, Chest PA and Lateral  Chest PA and Lateral   REASON FOR EXAM:    Shortness of breath  COMMENTS:       PROCEDURE: DXR - DXR CHEST PA (OR AP) AND LATERAL  - Feb 02 2015 11:30AM     CLINICAL DATA:  Shortness of breath.    EXAM:  CHEST  2 VIEW    COMPARISON:  None.    FINDINGS:  The heart size and mediastinal contours are within normal limits.  Both lungs are clear. No pneumothorax or pleural effusion is noted.  The visualized skeletal structures are unremarkable.     IMPRESSION:  No acute cardiopulmonary abnormality seen.      Electronically Signed    By: Marijo Conception, M.D.    On: 02/02/2015 12:15         Verified By: Marveen Reeks, M.D.,  [place]:    23-Feb-16 16:59, US Kidney Bilateral  US Kidney Bilateral   REASON FOR EXAM:    ARF  COMMENTS:       PROCEDURE: [place]  - US KIDNEY  - Feb 03 2015  4:59PM     CLINICAL DATA:  69 year old male with acute renal failure.    EXAM:  RENAL/URINARY TRACT ULTRASOUND  COMPLETE    COMPARISON:  None.    FINDINGS:  Right Kidney:  Length: 12.6 cm. Renal echogenicity is upper limits of normal. No  mass or hydronephrosis visualized.    Left Kidney:    Length: 13.2 cm. Renal echogenicity is upper limits of normal. No  mass or hydronephrosis visualized.    Bladder:    Decompressed and not well visualized.     IMPRESSION:  Normal sized kidneys with upper limits of normal renal echogenicity  which could be reflection of medical renal disease. No evidence of  hydronephrosis.    Bladder decompressed and not well visualized.      Electronically Signed    By: Margarette Canada M.D.    On: 02/03/2015 17:28         Verified By: Lura Em, M.D.,  Cardiology:    23-Feb-16 08:27, Echo Doppler  Echo Doppler   REASON FOR EXAM:      COMMENTS:       PROCEDURE: [facility] - ECHO DOPPLER COMPLETE(TRANSTHOR)  - Feb 03 2015  8:27AM     RESULT: Echocardiogram Report    Patient Name:   Dakota Whitaker Date  of Exam: 02/03/2015  Medical Rec #:  [id number]         Custom1:  Date of Birth:  October 26, 1946       Height:       70.0 in  Patient Age:    26 years       Weight:       200.0 lb  Patient Gender: M              BSA:          2.09 m??    Indications: Endocarditis  Sonographer:    Sherrie Sport RDCS  Referring Phys: Myrtis Ser, P    Sonographer Comments: Technically very difficult study due to very poor   echo windows and The only view obtainable was apical.    Summary:  After ruling out all contraindications, 1.39m per 8.5 ml of saline ml of   Definity contrast was infused for LV opacification and endocardial edge   detection.   1. Left ventricular ejection fraction, by visual estimation, is 55 to   60%.   2. Normal global left ventricular systolic function.   3. Impaired relaxation pattern of LV diastolic filling.  4. Mild left ventricular hypertrophy.  2D AND M-MODE MEASUREMENTS (normal ranges within parentheses):  Left Ventricle:          Normal  IVSd (2D):      1.44 cm (0.7-1.1)  LVPWd (2D):     1.16 cm (0.7-1.1) Aorta/LA:                  Normal  LVIDd (2D):     3.29 cm (3.4-5.7) Aortic Root (2D): 2.60 cm (2.4-3.7)  LVIDs (2D):     2.53 cm           Left Atrium (2D): 2.60 cm (1.9-4.0)  LV FS (2D):     23.1 %   (>25%)  LV EF (2D):     47.5 %   (>50%)                                    Right Ventricle:  RVd (2D):  LV SYSTOLIC FUNCTION BY 2D PLANIMETRY (MOD):  EF-A4C View: 62.8 %  LV DIASTOLIC FUNCTION:  MV Peak E: 0.63 m/s E/e' Ratio: 6.70  MV Peak A: 0.75 m/s Decel Time: 290 msec  E/A Ratio: 0.84  SPECTRAL DOPPLER ANALYSIS (where applicable):  Mitral Valve:  MV P1/2 Time: 84.10 msec  MV Area, PHT: 2.62 cm??  Aortic Valve: AoV Max Vel: 0.68 m/s AoV Peak PG: 1.8 mmHg AoV Mean PG:  LVOT Vmax: 0.86 m/s LVOT VTI:  LVOT Diameter: 2.00 cm  AoV Area, Vmax: 4.00 cm?? AoV Area, VTI:  AoV Area, Vmn:  Tricuspid Valve and PA/RV Systolic Pressure: TR Max  Velocity: 1.33 m/s RA   Pressure: 5 mmHg RVSP/PASP: 12.1 mmHg    PHYSICIAN INTERPRETATION:  Left Ventricle: The left ventricular internal cavity size was normal. LV   posterior wall thickness was normal. Mild left ventricular hypertrophy.   Global LV systolic function was normal. Left ventricular ejection   fraction, by visual estimation, is 55 to 60%. Spectral Doppler shows     impaired relaxation pattern of LV diastolic filling.  Left Atrium: The left atrium is normal in size.  Right Atrium: The right atrium was not well visualized.  Pericardium: There is no evidence of pericardial effusion.  Mitral Valve: The mitral valve is not well seen. The mitral valve is   normal in structure.  Tricuspid Valve: The tricuspid valve is not well seen. The tricuspid   valve is normal. The tricuspid regurgitant velocity is 1.33 m/s, and with   an assumed right atrial pressure of 5 mmHg, the estimated right   ventricular systolic pressure is normal at 12.1 mmHg.  Aortic Valve: The aortic valve was not well seen.  Pulmonic Valve: The pulmonic valve is not well seen.  Aorta: The ascending aorta was not well visualized.    [id number] Ida Rogue MD  Electronically signed by [id number] Ida Rogue MD  Signature Date/Time: 02/03/2015/10:03:29 AM    *** Final ***    IMPRESSION: .        Verified By: Minna Merritts, M.D., MD    No Known Allergies:   Vital Signs/Nurse's Notes: **Vital Signs.:   25-Feb-16 05:17  Vital Signs Type Routine  Temperature Temperature (F) 98.3  Celsius 36.8  Temperature Source oral  Pulse Pulse 69  Respirations Respirations 20  Systolic BP Systolic BP 371  Diastolic BP (mmHg) Diastolic BP (mmHg) 63  Mean BP 78  Pulse Ox % Pulse Ox % 93  Pulse Ox Activity Level  At rest  Oxygen Delivery Room Air/ 21 %    Impression 69 year old male with history of gastric bypass, gastrointestinal bleed due to NSAIDS about 15 years ago, cholecystectomy, and hernia repair who  presented to [facility] on 2/22 with a 4 month history of weakness.   1. Weakness: -4 month history -Consulted for TEE to evaluate for possible endocarditis. No murmur by exam. Patient has remained afebrile.  -Blood cultures negative to date -He does have an interesting physical exam along the left upper extremity, raising the question for bacterial endocarditis, though I suspect he would still be a low risk for this -Urine protein positive, ? MM, await remaing studies  2. ARF vs acute on CKD: -SCr improving -Renal on board  3. Acute on chronic anemia: -Status post transfusion x 3 -hgb stable at this time  4. Hypokalemia: -Replete to 4.0   Electronic Signatures for Addendum Section:  Kathlyn Sacramento (MD) (Signed Addendum 25-Feb-16 14:57)  The patient was seen and examined. Agree with the above. He might have splinter hemmorhages on left hand. However, no cardiac murmurs, bactermia or fever. Thus, the chance of IE is very low and does not justify a TEE.   Electronic Signatures: Kathlyn Sacramento (MD)  (Signed 25-Feb-16 14:57)  Co-Signer: General Aspect/Present Illness, History and Physical Exam, Review of System, Family & Social History, Past Medical History, Home Medications, Labs, EKG , Radiology, Allergies, Vital Signs/Nurse's Notes, Impression/Plan Eschol Auxier M (PA-C)  (Signed 25-Feb-16 09:08)  Authored: General Aspect/Present Illness, History and Physical Exam, Review of System, Family & Social History, Past Medical History, Home Medications, Labs, EKG , Radiology, Allergies, Vital Signs/Nurse's Notes, Impression/Plan   Last Updated: 25-Feb-16 14:57 by Kathlyn Sacramento (MD)

## 2015-04-12 NOTE — Consult Note (Signed)
Pt with signif anemia, elevated platelets, very hi sed rate, splinter hemorrhages and petechia.  recommend TEE by cardiology to check valves carefully for endocarditis.  Hematology consult coming. Pt vit D and B-12 very low. Supplements started. Likely due to previous gastric bypass.  Exam of chest, neck, heart, and abdomen all neg today. Will hemocult stool. Consider egd and colonoscopy after other parts of the global work-up are done and answer not found.  Electronic Signatures: Scot JunElliott, Robert T (MD)  (Signed on 24-Feb-16 14:59)  Authored  Last Updated: 24-Feb-16 14:59 by Scot JunElliott, Robert T (MD)

## 2016-02-27 IMAGING — CT CT ABD-PELV W/O CM
2 of 4 series · 16 of 46 positions shown, 18 images · non-contrast
Comparison: None.

CLINICAL DATA: 68-year-old with weight loss, weakness, anemia and
acute renal failure. History of gastrectomy.

EXAM:
CT ABDOMEN AND PELVIS WITHOUT CONTRAST
TECHNIQUE: Multidetector CT imaging of the abdomen and pelvis was performed
following the standard protocol without IV contrast.

[Series 2: routine abd pel wo · axial · 0.83mm/px · z∈[-1102,-642]mm · 13 of 102 slices shown, 15 images]
[im 5/102  soft-tissue]
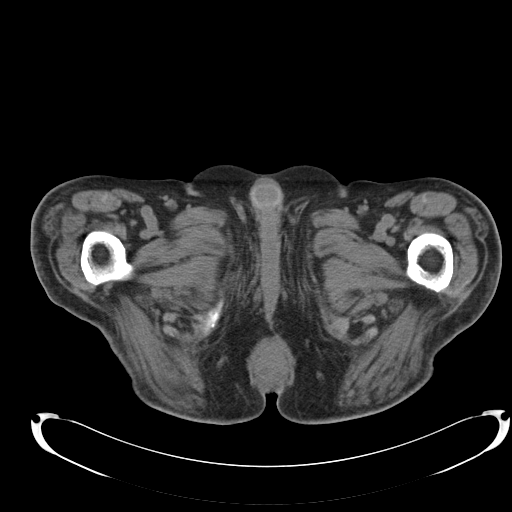
[im 5/102  bone]
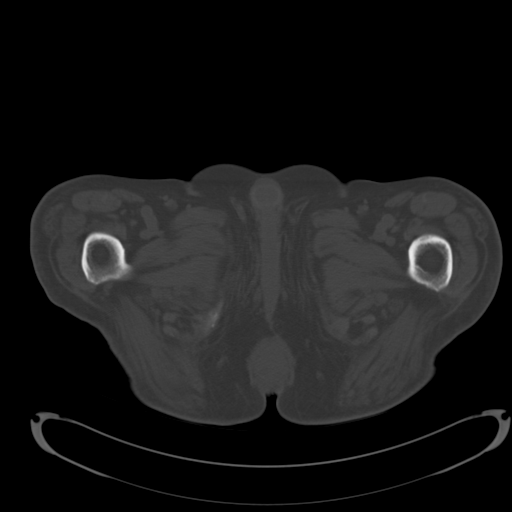
[im 14/102  soft-tissue]
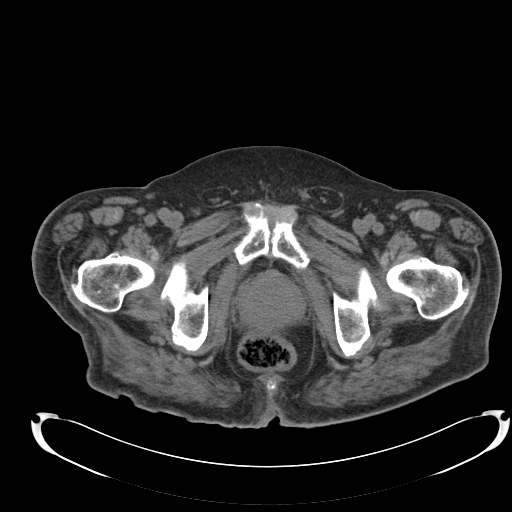
[im 23/102  soft-tissue]
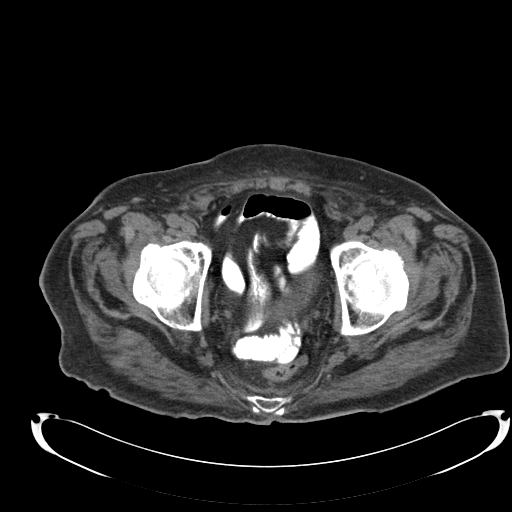
[im 28/102  soft-tissue]
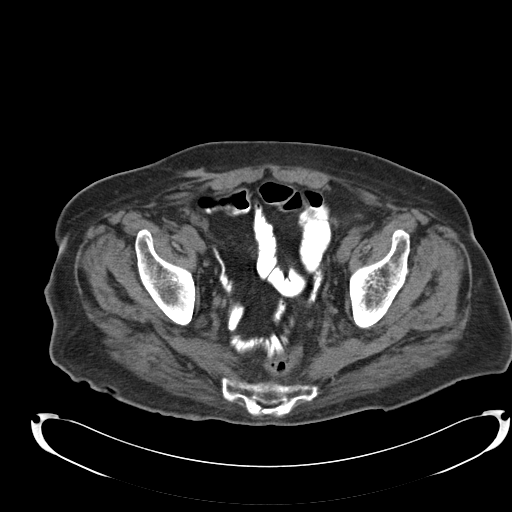
[im 37/102  soft-tissue]
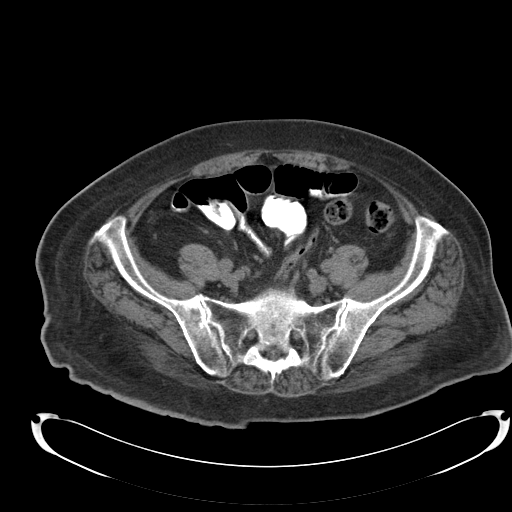
[im 42/102  soft-tissue]
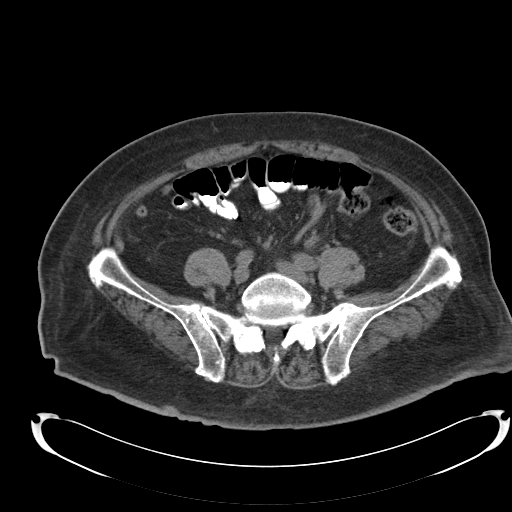
[im 51/102  soft-tissue]
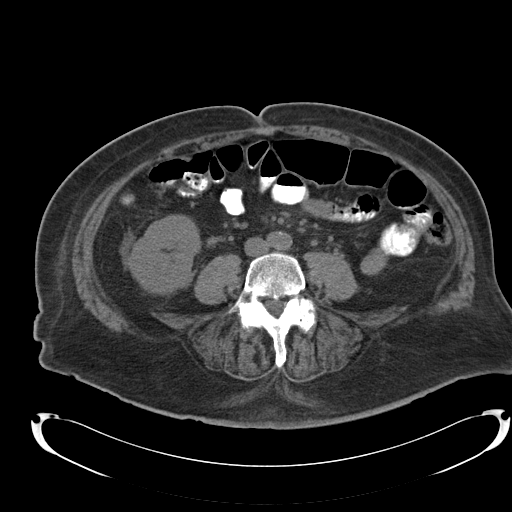
[im 60/102  soft-tissue]
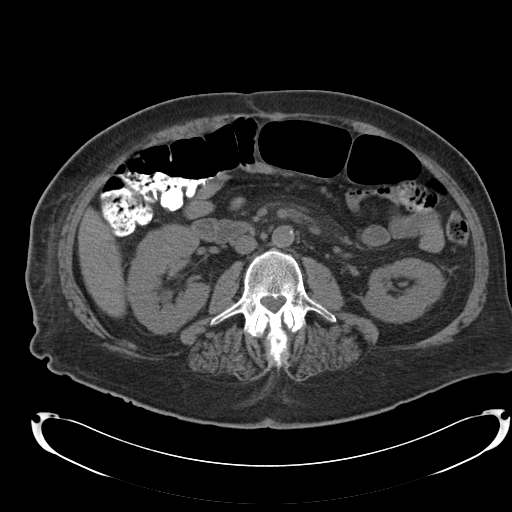
[im 65/102  soft-tissue]
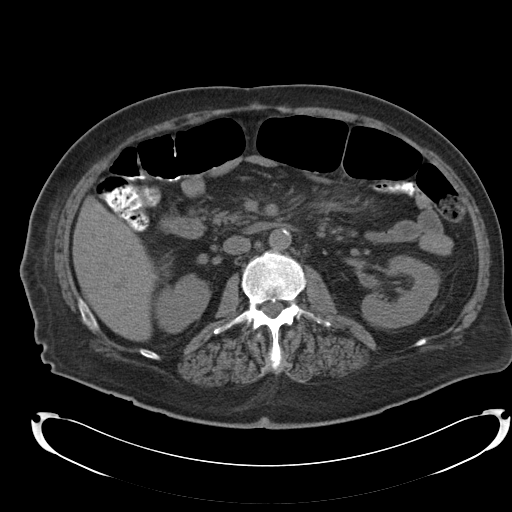
[im 65/102  bone]
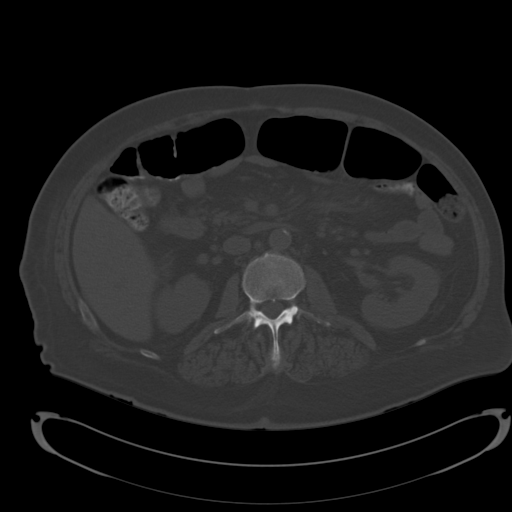
[im 74/102  soft-tissue]
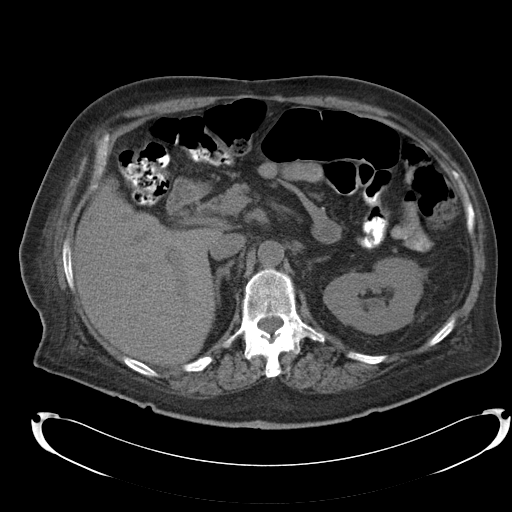
[im 79/102  soft-tissue]
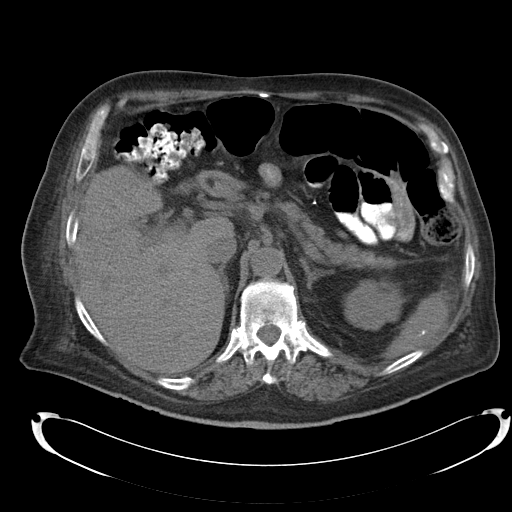
[im 88/102  soft-tissue]
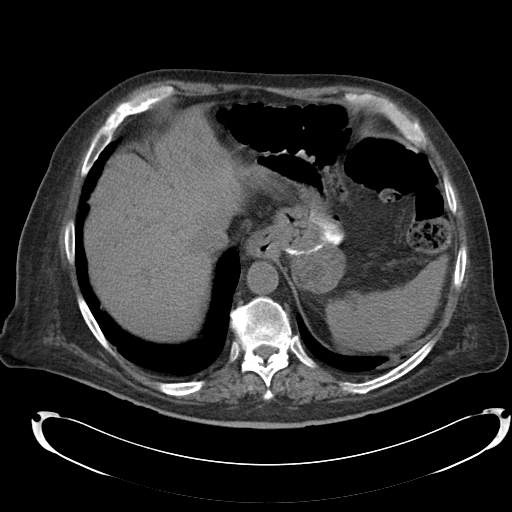
[im 97/102  soft-tissue]
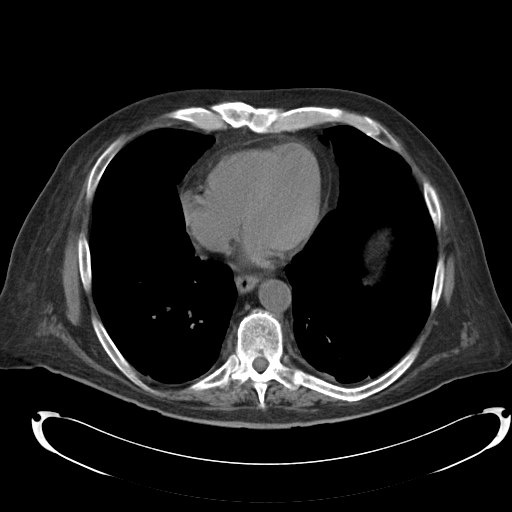

[Series 5: cor routine abd pel wo · coronal · 0.82mm/px · 3 of 172 slices shown]
[im 58/172  soft-tissue]
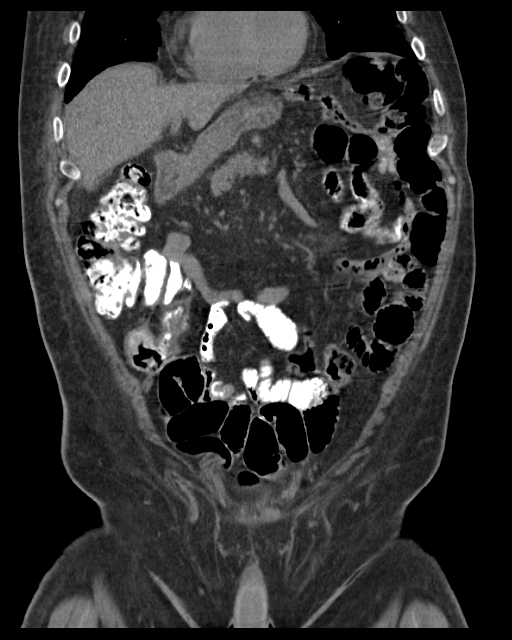
[im 77/172  soft-tissue]
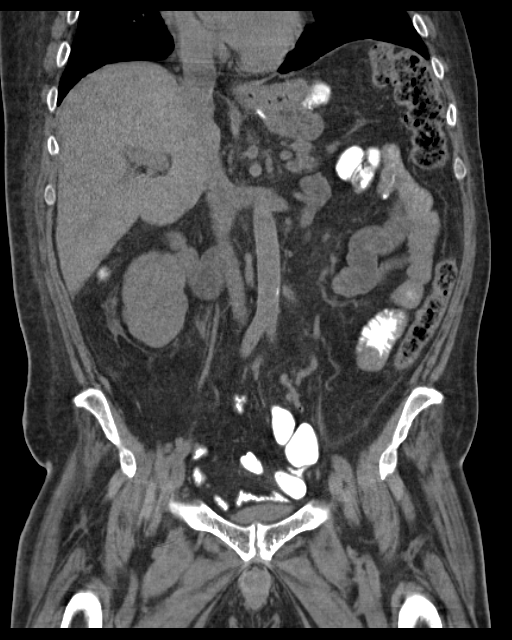
[im 96/172  soft-tissue]
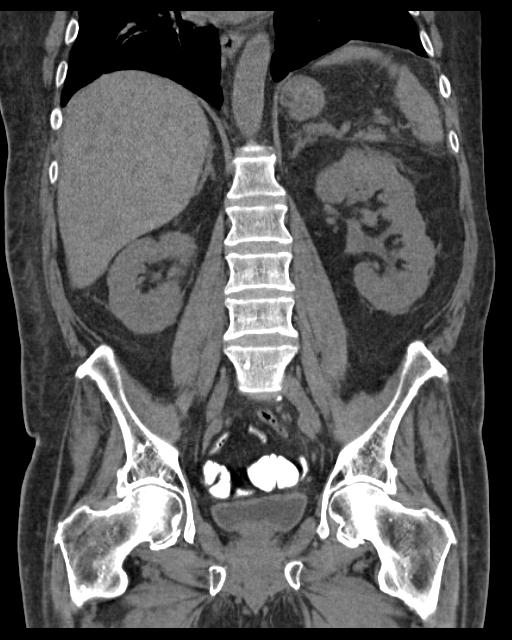

[16 of 46 positions shown; findings below may reference images not displayed]

FINDINGS: There are subtle parenchymal and centrilobular densities at the lung
bases, particularly on the right side. Question a right middle lobe
nodule measuring 5 mm on the first image. No significant pleural
fluid. No evidence free intraperitoneal air.

The gallbladder has been removed. No gross abnormality to the liver,
spleen or pancreas on this non contrast examination. Surgical
changes in the stomach. Prominent periaortic lymph node in the lower
chest measures 1.3 x 1.1 cm on sequence 2, image 11. Normal
appearance of the adrenal glands. There is no evidence for kidney
stones or hydronephrosis. No gross abnormality to either kidney.

No significant free fluid or lymphadenopathy within the abdomen or
pelvis. The prostate is prominent measuring 5.1 x 6.0 x 4.6 cm. No
gross abnormality to the urinary bladder. Cecum is in the right
upper abdomen and there is a normal appearance of the appendix. Oral
contrast within the small bowel and right colon. No evidence for a
bowel obstruction. There is mild edema and stranding within the
central abdominal mesentery. There is also mild subcutaneous edema.

Joint space narrowing in both hips. Degenerative facet arthropathy
in the lower lumbar spine.
IMPRESSION: No acute abnormalities within the abdomen or pelvis.

Postsurgical changes compatible with gastric surgery and
cholecystectomy.

There is mild edema in the central abdominal mesentery. Mesenteric
edema may be related to fluid overload because there is also
subcutaneous edema.

Patchy parenchymal disease at the lung bases, particularly on the
right side. Cannot exclude a right pulmonary nodule in the right
middle lobe. Findings could be related to an infectious or
inflammatory process. Recommend further evaluation with a dedicated
chest CT.

Prostate enlargement.

## 2018-03-24 NOTE — Progress Notes (Signed)
Cardiology Office Note  Date:  03/26/2018   ID:  Dakota Whitaker, Dakota Whitaker 01-Jul-1946, MRN [id number]  PCP:  Charolett Bumpers, PA-C   Chief Complaint  Patient presents with  . New Patient (Initial Visit)    Referred by Jacklynn Lewis for Palpitations. Meds reviewed verbally with patient.     HPI:  Mr. 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  Acute on chronic anemia requiring 3 units packed red blood cell transfusion, hemoglobin   7.4.  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  Obstructive sleep apnea unable to tolerate CPAP History of GI bleed secondary to NSAIDs Prior history of bradycardia Chronic shortness of breath Reports having severe pneumonia 05/11/2015, "almost died" Who presents by referral from Garth Bigness for consultation of his palpitations/ tachycardia  He reports having episodes of tachycardia Has had 3 episodes total last several weeks ago When he has an episode he feels flush, warm all over,lightheaded, with tachycardia Lasts for a few minutes When it comes, "rides it out" Not associated with any activity  Previous records reviewed including echocardiogram December 2016 that showed normal ejection fraction  Past week, quick burning sensation  Does his own ADLs, lives alone but no regular exercise program  Previous subglottic dilatations June 2017, January 2018, August 2018 Granulomatosis treated 05-11-2015 with cyclophosphamide prednisone December 2015 with hematuria acute renal failure creatinine up to 2.4 Kidney biopsy March 2016 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 , started on  cyclophosphamide and prednisone 05-30-2016C Anka positive July 2016 GI bleed, EGD revealed ulcer at anastomotic site started on PPI  Creatinine 1.3  Terms of his family history Father died 5 unknown causes mother died 55 lung cancer  EKG personally reviewed by myself on todays visit Shows normal sinus rhythm with rate 77 bpm right bundle branch block   PMH:   has a past medical history of Anemia, iron deficiency, Cervicalgia, Dizziness and giddiness, Glomerulonephritis, Hematuria, microscopic, Neck pain, Rosacea, SNHL (sensorineural hearing loss), Subglottic stenosis, 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  PSH:    Past Surgical History:  Procedure Laterality Date  . GASTRIC BYPASS     25 years ago  . Herna repair    . NOSE SURGERY      Current Outpatient Medications  Medication Sig Dispense Refill  . azaTHIOprine (IMURAN) 50 MG tablet Take 50 mg by mouth 2 (two) times daily.    . predniSONE (DELTASONE) 1 MG tablet Take 1 mg by mouth 3 (three) times daily.    . predniSONE (DELTASONE) 5 MG tablet Take 5 mg by mouth daily with breakfast.     No current facility-administered medications for this visit.      Allergies:   Patient has no known allergies.   Social History:  The patient  reports that he has never smoked. He has never used smokeless tobacco. He reports that he does not drink alcohol or use drugs.   Family History:   family history is not on file.    Review of Systems: Review of Systems  Constitutional: Negative.   Respiratory: Positive for shortness of breath.   Cardiovascular: Positive for palpitations.  Tachycardia  Gastrointestinal: Negative.   Musculoskeletal: Negative.   Neurological: Negative.   Psychiatric/Behavioral: Negative.   All other systems reviewed and are negative.    PHYSICAL EXAM: VS:  BP 130/78 (BP Location: Right Arm, Patient  Position: Sitting, Cuff Size: Normal)   Pulse 77   Ht 5\' 10"  (1.778 m)   Wt 229 lb (103.9 kg)   BMI 32.86 kg/m  , BMI Body mass index is 32.86 kg/m. GEN: Well nourished, well developed, in no acute distress obese,  HEENT: normal  Neck: no JVD, carotid bruits, or masses Cardiac: RRR; no murmurs, rubs, or gallops,no edema  Respiratory:  clear to auscultation bilaterally, mild increased work of breathing GI: soft, nontender, nondistended, + BS MS: no deformity or atrophy  Skin: warm and dry, no rash Neuro:  Strength and sensation are intact Psych: euthymic mood, full affect  Recent Labs: No results found for requested labs within last 8760 hours.    Lipid Panel No results found for: CHOL, HDL, LDLCALC, TRIG    Wt Readings from Last 3 Encounters:  03/26/18 229 lb (103.9 kg)       ASSESSMENT AND PLAN:  Paroxysmal tachycardia (HCC) - Plan: EKG 12-Lead 3 episodes of tachycardia he is aware of, though last was 1 month ago Very symptomatic with flushing, lightheadedness, heart pounding Unable to exclude atrial fibrillation, flutter, atrial tachycardia, SVT 30-day event monitor has been ordered for further review/evaluation Instructions provided, order has been placed, this will come to him in the mail in the next day or so We will call him with the results Recommended he keep accurate diary of additional episodes  Chronic anemia Previous hospitalizations discussed with him end of 2015 and 2016 Details as above for his granulomatosis On chronic prednisone  Chronic renal failure, stage 3 (moderate) (HCC)  managed by nephrology  History of bradycardia Denies any issues with bradycardia  History of GI bleed Remote history of GI bleed at site of anastomosis Denies any recent bleeding  Subglottic stenosis Somewhat labored breathing on today's visit He has close follow-up with pulmonary for subglottic stenosis requiring periodic dilatation  Disposition:   F/U as  needed  Detailed records reviewed of prior hospitalizations Long discussion concerning various types of arrhythmias  Total encounter time more than 60 minutes  Greater than 50% was spent in counseling and coordination of care with the patient    Orders Placed This Encounter  Procedures  . EKG 12-Lead     Signed, Dossie Arbourim Arleigh Dicola, M.D., Ph.D. 03/26/2018  [facility] [place] [phone]

## 2018-03-26 ENCOUNTER — Ambulatory Visit (INDEPENDENT_AMBULATORY_CARE_PROVIDER_SITE_OTHER): Payer: No Typology Code available for payment source | Admitting: Cardiovascular Disease

## 2018-03-26 ENCOUNTER — Encounter: Payer: Self-pay | Admitting: Cardiovascular Disease

## 2018-03-26 VITALS — BP 130/78 | HR 77 | Ht 70.0 in | Wt 229.0 lb

## 2018-03-26 DIAGNOSIS — R002 Palpitations: Secondary | ICD-10-CM | POA: Diagnosis not present

## 2018-03-26 DIAGNOSIS — N183 Chronic kidney disease, stage 3 unspecified: Secondary | ICD-10-CM | POA: Insufficient documentation

## 2018-03-26 DIAGNOSIS — J386 Stenosis of larynx: Secondary | ICD-10-CM | POA: Insufficient documentation

## 2018-03-26 DIAGNOSIS — Z87898 Personal history of other specified conditions: Secondary | ICD-10-CM

## 2018-03-26 DIAGNOSIS — I479 Paroxysmal tachycardia, unspecified: Secondary | ICD-10-CM | POA: Insufficient documentation

## 2018-03-26 DIAGNOSIS — D649 Anemia, unspecified: Secondary | ICD-10-CM | POA: Insufficient documentation

## 2018-03-26 DIAGNOSIS — Z8679 Personal history of other diseases of the circulatory system: Secondary | ICD-10-CM

## 2018-03-26 DIAGNOSIS — Z8719 Personal history of other diseases of the digestive system: Secondary | ICD-10-CM | POA: Insufficient documentation

## 2018-03-26 NOTE — Patient Instructions (Addendum)
Medication Instructions:   No medication changes made  Labwork:  No new labs needed  Testing/Procedures:  We will order a 30 day monitor for tachycardia/flushing, lightheaded   Follow-Up: It was a pleasure seeing you in the office today. Please call us if you have new issues that need to be addressed before your next appt.  [phone]  Your physician wants you to follow-up in:  As needed  If you need a refill on your cardiac medications before your next appointment, please call your pharmacy.  For educational health videos Log in to : [email or web address] Or : [email or web address], password : [email or web address]

## 2018-03-27 NOTE — Addendum Note (Signed)
Addended by: Sherri RadMCGHEE, Elvena Oyer C on: 03/27/2018 04:23 PM   Modules accepted: Orders

## 2018-03-28 ENCOUNTER — Encounter: Payer: Self-pay | Admitting: Cardiovascular Disease

## 2018-03-28 DIAGNOSIS — R002 Palpitations: Secondary | ICD-10-CM

## 2018-03-28 NOTE — Progress Notes (Signed)
Event monitor ordered in [employer] (to be sent to the patient's home).

## 2018-04-12 ENCOUNTER — Ambulatory Visit: Payer: No Typology Code available for payment source | Admitting: Internal Medicine

## 2018-04-13 ENCOUNTER — Ambulatory Visit: Payer: No Typology Code available for payment source

## 2018-04-13 DIAGNOSIS — R002 Palpitations: Secondary | ICD-10-CM

## 2018-05-10 ENCOUNTER — Ambulatory Visit: Payer: No Typology Code available for payment source | Admitting: Internal Medicine

## 2018-05-16 ENCOUNTER — Ambulatory Visit
Admission: RE | Admit: 2018-05-16 | Discharge: 2018-05-16 | Disposition: A | Discharge status: Home / Self Care | Payer: Medicare Other | Source: Ambulatory Visit | Attending: Nurse Practitioner | Admitting: Nurse Practitioner

## 2018-05-16 DIAGNOSIS — R002 Palpitations: Secondary | ICD-10-CM | POA: Insufficient documentation

## 2018-06-05 ENCOUNTER — Encounter: Payer: Self-pay | Admitting: *Deleted

## 2018-06-21 ENCOUNTER — Ambulatory Visit: Payer: No Typology Code available for payment source | Admitting: Internal Medicine

## 2018-06-22 ENCOUNTER — Encounter: Payer: Self-pay | Admitting: Internal Medicine
